# Patient Record
Sex: Female | Born: 1980 | Race: White | Hispanic: No | Marital: Married | Smoking: Never smoker
Health system: Southern US, Community
[De-identification: ages and names within clinical notes are randomized; demographics above are authoritative.]

## PROBLEM LIST (undated history)

## (undated) DIAGNOSIS — R011 Cardiac murmur, unspecified: Secondary | ICD-10-CM

---
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
## 2003-02-17 ENCOUNTER — Other Ambulatory Visit: Admission: RE | Admit: 2003-02-17 | Discharge: 2003-02-17 | Payer: Self-pay | Admitting: Obstetrics and Gynecology

## 2004-04-19 ENCOUNTER — Other Ambulatory Visit: Admission: RE | Admit: 2004-04-19 | Discharge: 2004-04-19 | Payer: Self-pay | Admitting: Family Medicine

## 2005-01-17 ENCOUNTER — Other Ambulatory Visit: Admission: RE | Admit: 2005-01-17 | Discharge: 2005-01-17 | Payer: Self-pay | Admitting: Family Medicine

## 2005-11-27 ENCOUNTER — Other Ambulatory Visit: Admission: RE | Admit: 2005-11-27 | Discharge: 2005-11-27 | Payer: Self-pay | Admitting: Family Medicine

## 2005-11-28 ENCOUNTER — Other Ambulatory Visit: Admission: RE | Admit: 2005-11-28 | Discharge: 2005-11-28 | Payer: Self-pay | Admitting: Family Medicine

## 2006-06-22 ENCOUNTER — Other Ambulatory Visit: Admission: RE | Admit: 2006-06-22 | Discharge: 2006-06-22 | Payer: Self-pay | Admitting: Family Medicine

## 2016-10-30 ENCOUNTER — Encounter: Payer: Self-pay | Admitting: Emergency Medicine

## 2016-10-30 ENCOUNTER — Emergency Department (INDEPENDENT_AMBULATORY_CARE_PROVIDER_SITE_OTHER): Payer: BLUE CROSS/BLUE SHIELD

## 2016-10-30 ENCOUNTER — Emergency Department
Admission: EM | Admit: 2016-10-30 | Discharge: 2016-10-30 | Disposition: A | Discharge status: Home / Self Care | Payer: BLUE CROSS/BLUE SHIELD | Source: Home / Self Care | Attending: Family Medicine | Admitting: Family Medicine

## 2016-10-30 DIAGNOSIS — J111 Influenza due to unidentified influenza virus with other respiratory manifestations: Secondary | ICD-10-CM

## 2016-10-30 DIAGNOSIS — R05 Cough: Secondary | ICD-10-CM

## 2016-10-30 DIAGNOSIS — R69 Illness, unspecified: Secondary | ICD-10-CM | POA: Diagnosis not present

## 2016-10-30 DIAGNOSIS — R509 Fever, unspecified: Secondary | ICD-10-CM | POA: Diagnosis not present

## 2016-10-30 HISTORY — DX: Cardiac murmur, unspecified: R01.1

## 2016-10-30 MED ORDER — GUAIFENESIN-CODEINE 100-10 MG/5ML PO SOLN: ORAL | 0 refills | Status: AC

## 2016-10-30 MED ORDER — OSELTAMIVIR PHOSPHATE 75 MG PO CAPS: 75.0000 mg | ORAL_CAPSULE | Freq: Two times a day (BID) | ORAL | 0 refills | Status: AC

## 2016-10-30 NOTE — ED Triage Notes (Signed)
Reports onset of cough, fever, congestion, weakness and mild shortness of breath. Took Mucinex early a.m.

## 2016-10-30 NOTE — ED Provider Notes (Signed)
[facility]    CSN: [id number] Arrival date & time: 10/30/16  1136     History   Chief Complaint Chief Complaint  Patient presents with  . Fever  . Cough  . Nasal Congestion  . Weakness    HPI Kathleen Haney is a 36 y.o. female.   Four days ago patient developed mild cough and sinus congestion.  Over the next two days she developed flu-like symptoms including myalgias, headache, fever/chills, fatigue, and increased cough. Cough is non-productive and worse at night.  She has experienced tightness in her anterior chest and shortness of breath, but no pleuritic pain.   She has a distant past history of pneumonia.   The history is provided by the patient.    Past Medical History:  Diagnosis Date  . Heart murmur     There are no active problems to display for this patient.   Past Surgical History:  Procedure Laterality Date  . CESAREAN SECTION      OB History    No data available       Home Medications    Prior to Admission medications   Medication Sig Start Date End Date Taking? Authorizing Provider  norethindrone-ethinyl estradiol-iron (ESTROSTEP FE,TILIA FE,TRI-LEGEST FE) 1-20/1-30/1-35 MG-MCG tablet Take 1 tablet by mouth daily.   Yes Historical Provider, MD  guaiFENesin-codeine 100-10 MG/5ML syrup Take 10mL by mouth at bedtime as needed for cough 10/30/16   Lattie HawStephen A Beese, MD  oseltamivir (TAMIFLU) 75 MG capsule Take 1 capsule (75 mg total) by mouth every 12 (twelve) hours. 10/30/16   Lattie HawStephen A Beese, MD    Family History History reviewed. No pertinent family history.  Social History Social History  Substance Use Topics  . Smoking status: Never Smoker  . Smokeless tobacco: Never Used  . Alcohol use No     Allergies   Patient has no known allergies.   Review of Systems Review of Systems No sore throat + cough No pleuritic pain ? wheezing + nasal congestion + post-nasal drainage No sinus pain/pressure No itchy/red eyes No  earache No hemoptysis + SOB + fever, + chills No nausea No vomiting No abdominal pain No diarrhea No urinary symptoms No skin rash + fatigue + myalgias + headache Used OTC meds without relief   Physical Exam Triage Vital Signs ED Triage Vitals  Enc Vitals Group     BP 10/30/16 1235 100/68     Pulse Rate 10/30/16 1235 101     Resp 10/30/16 1235 18     Temp 10/30/16 1235 100.2 F (37.9 C)     Temp Source 10/30/16 1235 Oral     SpO2 10/30/16 1235 97 %     Weight 10/30/16 1236 135 lb (61.2 kg)     Height 10/30/16 1236 5\' 4"  (1.626 m)     Head Circumference --      Peak Flow --      Pain Score 10/30/16 1240 2     Pain Loc --      Pain Edu? --      Excl. in [place]? --    No data found.   Updated Vital Signs BP 100/68 (BP Location: Left Arm)   Pulse 101   Temp 100.2 F (37.9 C) (Oral)   Resp 18   Ht 5\' 4"  (1.626 m)   Wt 135 lb (61.2 kg)   LMP 10/26/2016 (Exact Date)   SpO2 97%   BMI 23.17 kg/m   Visual Acuity Right Eye Distance:  Left Eye Distance:   Bilateral Distance:    Right Eye Near:   Left Eye Near:    Bilateral Near:     Physical Exam Nursing notes and Vital Signs reviewed. Appearance:  Patient appears stated age, and in no acute distress Eyes:  Pupils are equal, round, and reactive to light and accomodation.  Extraocular movement is intact.  Conjunctivae are not inflamed  Ears:  Canals normal.  Tympanic membranes normal.  Nose:  Mildly congested turbinates.  No sinus tenderness.   Pharynx:  Normal Neck:  Supple.  Tender enlarged posterior/lateral nodes are palpated bilaterally  Lungs:  Clear to auscultation.  Breath sounds are equal.  Moving air well. Heart:  Regular rate and rhythm without murmurs, rubs, or gallops.  Abdomen:  Nontender without masses or hepatosplenomegaly.  Bowel sounds are present.  No CVA or flank tenderness.  Extremities:  No edema.  Skin:  No rash present.    UC Treatments / Results  Labs (all labs ordered are listed,  but only abnormal results are displayed) Labs Reviewed - No data to display  EKG  EKG Interpretation None       Radiology Dg Chest 2 View  Result Date: 10/30/2016 CLINICAL DATA:  Cough for 4 days.  Fever for 1 day EXAM: CHEST  2 VIEW COMPARISON:  None. FINDINGS: The lungs are clear. Heart size and pulmonary vascularity are normal. No adenopathy. No bone lesions. IMPRESSION: No edema or consolidation. Electronically Signed   By: Bretta Bang III M.D.   On: 10/30/2016 13:29    Procedures Procedures (including critical care time)  Medications Ordered in UC Medications - No data to display   Initial Impression / Assessment and Plan / UC Course  I have reviewed the triage vital signs and the nursing notes.  Pertinent labs & imaging results that were available during my care of the patient were reviewed by me and considered in my medical decision making (see chart for details).    Normal chest X-ray reassuring. Begin Tamiflu. Rx for Robitussin AC for night time cough.  Take plain guaifenesin (1200mg  extended release tabs such as Mucinex) twice daily, with plenty of water, for cough and congestion.  May add Pseudoephedrine (30mg , one or two every 4 to 6 hours) for sinus congestion.  Get adequate rest.   Try warm salt water gargles for sore throat.  Stop all antihistamines for now, and other non-prescription cough/cold preparations. May take Ibuprofen 200mg , 4 tabs every 8 hours with food for body aches, fever, headache, etc. Followup with Family Doctor if not improved in one week.     Final Clinical Impressions(s) / UC Diagnoses   Final diagnoses:  Influenza-like illness    New Prescriptions New Prescriptions   GUAIFENESIN-CODEINE 100-10 MG/5ML SYRUP    Take 10mL by mouth at bedtime as needed for cough   OSELTAMIVIR (TAMIFLU) 75 MG CAPSULE    Take 1 capsule (75 mg total) by mouth every 12 (twelve) hours.     Lattie Haw, MD 10/30/16 [id number]

## 2016-10-30 NOTE — Discharge Instructions (Signed)
Take plain guaifenesin (1200mg  extended release tabs such as Mucinex) twice daily, with plenty of water, for cough and congestion.  May add Pseudoephedrine (30mg , one or two every 4 to 6 hours) for sinus congestion.  Get adequate rest.   Try warm salt water gargles for sore throat.  Stop all antihistamines for now, and other non-prescription cough/cold preparations. May take Ibuprofen 200mg , 4 tabs every 8 hours with food for body aches, fever, headache, etc.

## 2018-02-03 IMAGING — DX DG CHEST 2V
2 series · 2 of 2 positions shown · non-contrast
Comparison: None.

CLINICAL DATA: Cough for 4 days.  Fever for 1 day

EXAM:
CHEST  2 VIEW

[chest pa]
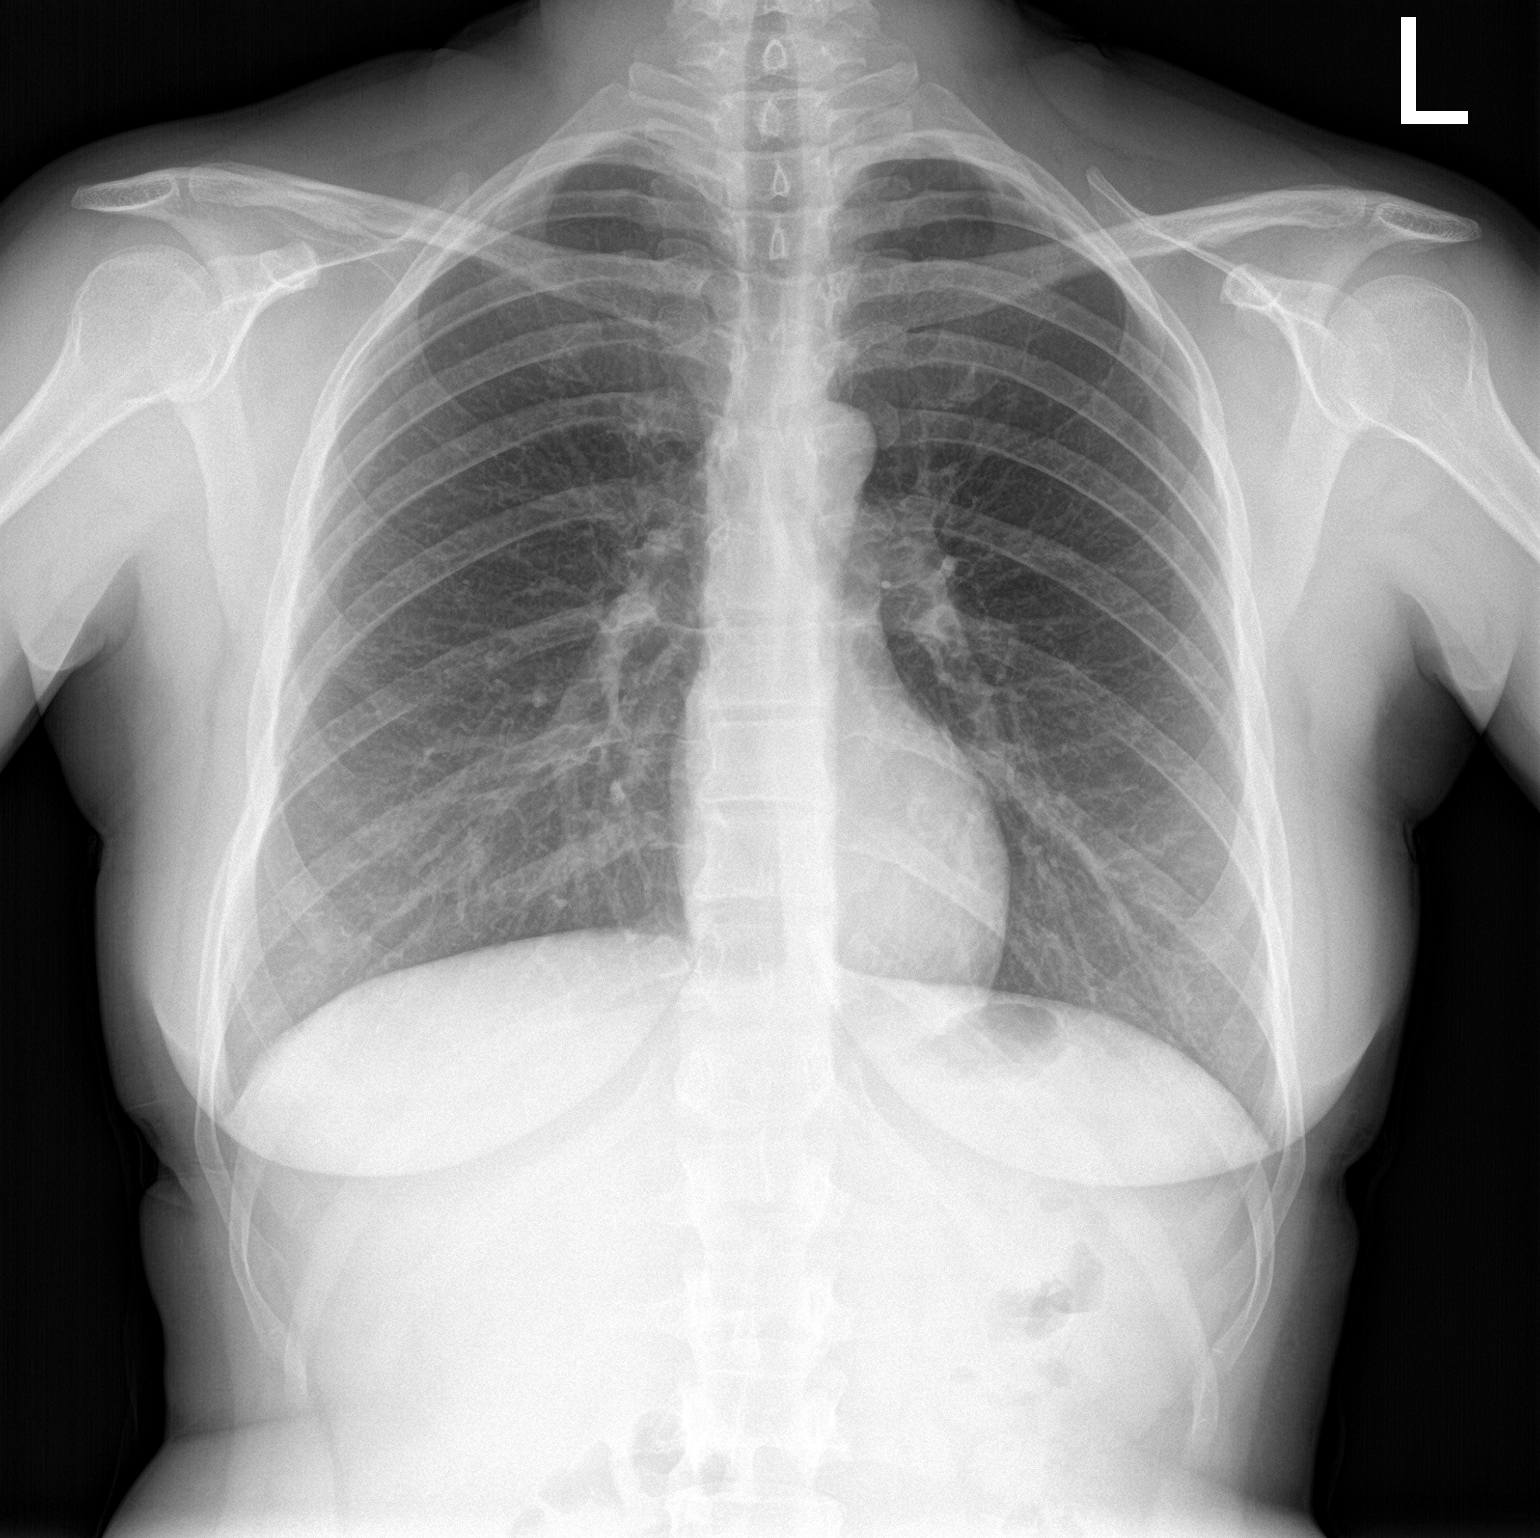

[chest lat]
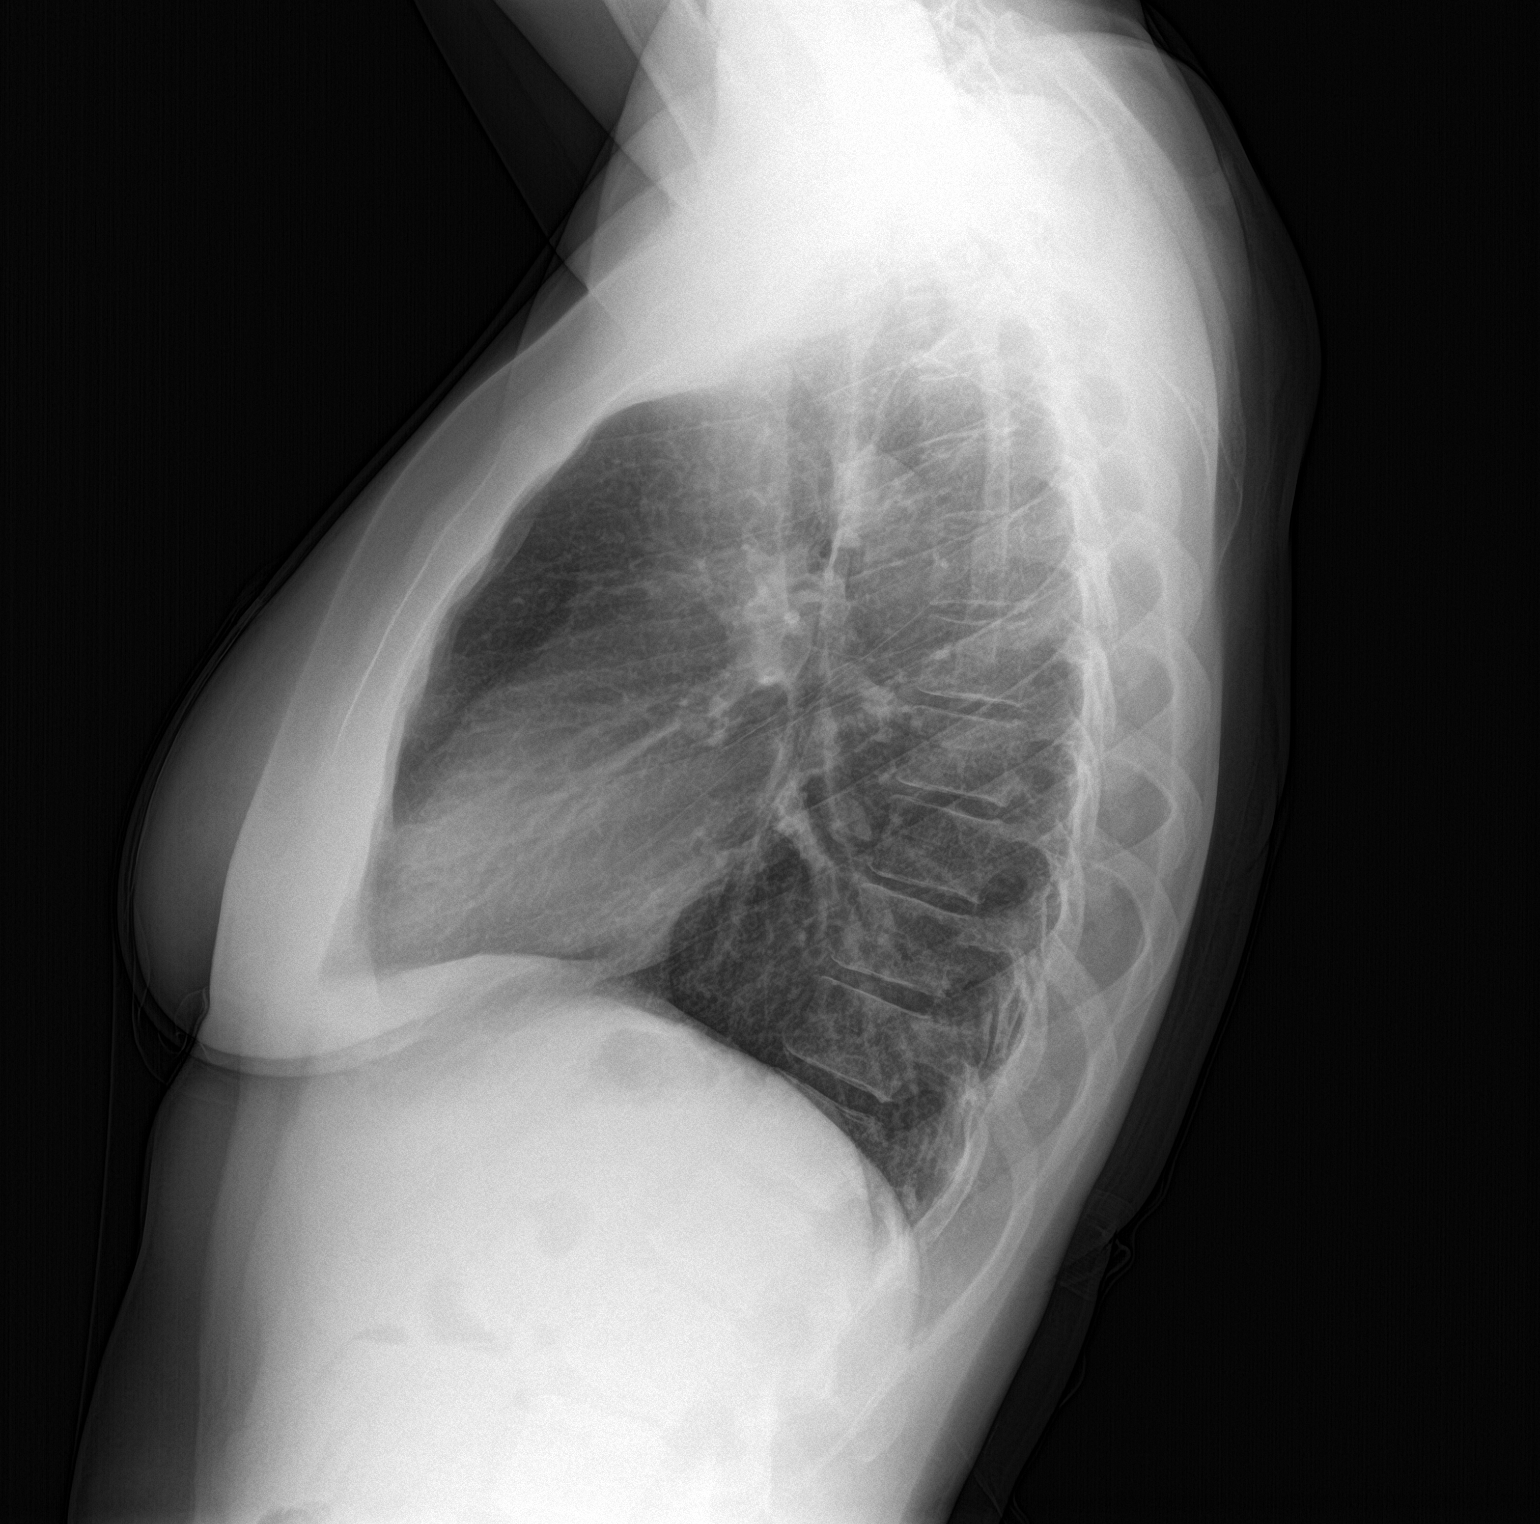

[2 of 2 positions shown; findings below may reference images not displayed]

FINDINGS: The lungs are clear. Heart size and pulmonary vascularity are
normal. No adenopathy. No bone lesions.
IMPRESSION: No edema or consolidation.

## 2019-09-22 ENCOUNTER — Other Ambulatory Visit: Payer: Self-pay

## 2019-09-22 ENCOUNTER — Emergency Department
Admission: EM | Admit: 2019-09-22 | Discharge: 2019-09-22 | Disposition: A | Discharge status: Home / Self Care | Payer: BLUE CROSS/BLUE SHIELD | Source: Home / Self Care

## 2019-09-22 DIAGNOSIS — G8929 Other chronic pain: Secondary | ICD-10-CM | POA: Diagnosis not present

## 2019-09-22 DIAGNOSIS — R519 Headache, unspecified: Secondary | ICD-10-CM | POA: Diagnosis not present

## 2019-09-22 NOTE — ED Triage Notes (Signed)
Pt states that she has had a headache om the right side of her head for about a week now.  No hx of headache.  Comes and goes.  On the right, when moving and bending it is worse.  No vision changes or dizziness noted.  Had Covid about 4 months ago.  Has had several weird episodes the last couple of months.

## 2019-09-22 NOTE — Discharge Instructions (Signed)
See your Physician for recheck if headaches persist. Try Excedrin migraine

## 2019-09-26 NOTE — ED Provider Notes (Signed)
[facility]    CSN: [id number] Arrival date & time: 09/22/19  0947      History   Chief Complaint Chief Complaint  Patient presents with  . Headache    HPI Kathleen Haney is a 39 y.o. female.   The history is provided by the patient. No language interpreter was used.  Headache Pain location:  Generalized Quality:  Unable to specify Severity currently:  7/10 Severity at highest:  7/10 Timing:  Constant Progression:  Worsening Chronicity:  New Relieved by:  Nothing Worsened by:  Nothing Associated symptoms: no fever, no neck pain and no sore throat   Pt had covid 4 months ago.  Pt has had a headache since.    Past Medical History:  Diagnosis Date  . Heart murmur     There are no problems to display for this patient.   Past Surgical History:  Procedure Laterality Date  . CESAREAN SECTION      OB History   No obstetric history on file.      Home Medications    Prior to Admission medications   Medication Sig Start Date End Date Taking? Authorizing Provider  guaiFENesin-codeine 100-10 MG/5ML syrup Take 31mL by mouth at bedtime as needed for cough 10/30/16   Kandra Nicolas, MD  norethindrone-ethinyl estradiol-iron (ESTROSTEP FE,TILIA FE,TRI-LEGEST FE) 1-20/1-30/1-35 MG-MCG tablet Take 1 tablet by mouth daily.    [provider]  oseltamivir (TAMIFLU) 75 MG capsule Take 1 capsule (75 mg total) by mouth every 12 (twelve) hours. 10/30/16   Kandra Nicolas, MD    Family History Family History  Problem Relation Age of Onset  . Hypertension Mother   . Cancer Brother     Social History Social History   Tobacco Use  . Smoking status: Never Smoker  . Smokeless tobacco: Never Used  Substance Use Topics  . Alcohol use: No  . Drug use: Not on file     Allergies   Patient has no known allergies.   Review of Systems Review of Systems  Constitutional: Negative for fever.  HENT: Negative for sore throat.   Musculoskeletal: Negative  for neck pain.  Neurological: Positive for headaches.  All other systems reviewed and are negative.    Physical Exam Triage Vital Signs ED Triage Vitals  Enc Vitals Group     BP 09/22/19 1009 130/70     Pulse Rate 09/22/19 1009 (!) 59     Resp 09/22/19 1009 20     Temp 09/22/19 1009 98.7 F (37.1 C)     Temp Source 09/22/19 1009 Oral     SpO2 09/22/19 1009 100 %     Weight 09/22/19 1010 136 lb (61.7 kg)     Height 09/22/19 1010 5\' 4"  (1.626 m)     Head Circumference --      Peak Flow --      Pain Score 09/22/19 1010 5     Pain Loc --      Pain Edu? --      Excl. in [place]? --    No data found.  Updated Vital Signs BP 130/70 (BP Location: Right Arm)   Pulse (!) 59   Temp 98.7 F (37.1 C) (Oral)   Resp 20   Ht 5\' 4"  (1.626 m)   Wt 61.7 kg   LMP 09/12/2019   SpO2 100%   BMI 23.34 kg/m   Visual Acuity Right Eye Distance:   Left Eye Distance:   Bilateral Distance:  Right Eye Near:   Left Eye Near:    Bilateral Near:     Physical Exam Vitals and nursing note reviewed.  Constitutional:      Appearance: She is well-developed.  HENT:     Head: Normocephalic.  Cardiovascular:     Rate and Rhythm: Normal rate.  Pulmonary:     Effort: Pulmonary effort is normal.  Abdominal:     General: There is no distension.  Musculoskeletal:        General: Normal range of motion.     Cervical back: Normal range of motion.  Neurological:     Mental Status: She is alert and oriented to person, place, and time.     Cranial Nerves: No cranial nerve deficit.     Sensory: No sensory deficit.     Motor: No weakness.  Psychiatric:        Mood and Affect: Mood normal.        Speech: Speech normal.        Behavior: Behavior normal.      UC Treatments / Results  Labs (all labs ordered are listed, but only abnormal results are displayed) Labs Reviewed - No data to display  EKG   Radiology No results found.  Procedures Procedures (including critical care  time)  Medications Ordered in UC Medications - No data to display  Initial Impression / Assessment and Plan / UC Course  I have reviewed the triage vital signs and the nursing notes.  Pertinent labs & imaging results that were available during my care of the patient were reviewed by me and considered in my medical decision making (see chart for details).     MDM  Pt has normal neuro exam.  Pt advised to try excedrin.  Pt to follow up with primary care for recheck  Final Clinical Impressions(s) / UC Diagnoses   Final diagnoses:  Chronic nonintractable headache, unspecified headache type     Discharge Instructions     See your Physician for recheck if headaches persist. Try Excedrin migraine      ED Prescriptions    None     PDMP not reviewed this encounter.  An After Visit Summary was printed and given to the patient.   Elson Areas, [place] 09/26/19 [phone]
